# Patient Record
Sex: Female | Born: 2000 | Race: White | Hispanic: No | Marital: Single | Smoking: Never smoker
Health system: Southern US, Community
[De-identification: ages and names within clinical notes are randomized; demographics above are authoritative.]

## PROBLEM LIST (undated history)

## (undated) DIAGNOSIS — J45909 Unspecified asthma, uncomplicated: Secondary | ICD-10-CM

---
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
## 2021-04-20 ENCOUNTER — Other Ambulatory Visit: Payer: Self-pay | Admitting: Obstetrics and Gynecology

## 2021-04-23 ENCOUNTER — Other Ambulatory Visit: Payer: Self-pay | Admitting: Obstetrics and Gynecology

## 2021-04-23 DIAGNOSIS — N6310 Unspecified lump in the right breast, unspecified quadrant: Secondary | ICD-10-CM

## 2021-05-19 ENCOUNTER — Emergency Department (HOSPITAL_BASED_OUTPATIENT_CLINIC_OR_DEPARTMENT_OTHER): Payer: No Typology Code available for payment source | Admitting: Radiology

## 2021-05-19 ENCOUNTER — Encounter (HOSPITAL_BASED_OUTPATIENT_CLINIC_OR_DEPARTMENT_OTHER): Payer: Self-pay | Admitting: Emergency Medicine

## 2021-05-19 ENCOUNTER — Emergency Department (HOSPITAL_BASED_OUTPATIENT_CLINIC_OR_DEPARTMENT_OTHER): Payer: No Typology Code available for payment source

## 2021-05-19 ENCOUNTER — Other Ambulatory Visit: Payer: Self-pay

## 2021-05-19 ENCOUNTER — Emergency Department (HOSPITAL_BASED_OUTPATIENT_CLINIC_OR_DEPARTMENT_OTHER)
Admission: EM | Admit: 2021-05-19 | Discharge: 2021-05-19 | Disposition: A | Discharge status: Home / Self Care | Payer: No Typology Code available for payment source | Attending: Emergency Medicine | Admitting: Emergency Medicine

## 2021-05-19 DIAGNOSIS — S20219A Contusion of unspecified front wall of thorax, initial encounter: Secondary | ICD-10-CM | POA: Insufficient documentation

## 2021-05-19 DIAGNOSIS — J45909 Unspecified asthma, uncomplicated: Secondary | ICD-10-CM | POA: Insufficient documentation

## 2021-05-19 DIAGNOSIS — R040 Epistaxis: Secondary | ICD-10-CM | POA: Diagnosis not present

## 2021-05-19 DIAGNOSIS — S0990XA Unspecified injury of head, initial encounter: Secondary | ICD-10-CM | POA: Insufficient documentation

## 2021-05-19 DIAGNOSIS — Y9289 Other specified places as the place of occurrence of the external cause: Secondary | ICD-10-CM | POA: Insufficient documentation

## 2021-05-19 HISTORY — DX: Unspecified asthma, uncomplicated: J45.909
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
## 2021-05-25 ENCOUNTER — Inpatient Hospital Stay: Admission: RE | Admit: 2021-05-25 | Payer: BLUE CROSS/BLUE SHIELD | Source: Ambulatory Visit
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
